# Patient Record
Sex: Male | Born: 2000 | Race: White | Hispanic: No | Marital: Single | Smoking: Current every day smoker
Health system: Southern US, Community
[De-identification: ages and names within clinical notes are randomized; demographics above are authoritative.]

## PROBLEM LIST (undated history)

## (undated) DIAGNOSIS — S42302A Unspecified fracture of shaft of humerus, left arm, initial encounter for closed fracture: Secondary | ICD-10-CM

## (undated) DIAGNOSIS — Z3A36 36 weeks gestation of pregnancy: Secondary | ICD-10-CM

## (undated) DIAGNOSIS — F909 Attention-deficit hyperactivity disorder, unspecified type: Secondary | ICD-10-CM

## (undated) DIAGNOSIS — F419 Anxiety disorder, unspecified: Secondary | ICD-10-CM

## (undated) DIAGNOSIS — H5010 Unspecified exotropia: Secondary | ICD-10-CM

## (undated) DIAGNOSIS — Z8781 Personal history of (healed) traumatic fracture: Secondary | ICD-10-CM

## (undated) DIAGNOSIS — Z8619 Personal history of other infectious and parasitic diseases: Secondary | ICD-10-CM

## (undated) DIAGNOSIS — Z87898 Personal history of other specified conditions: Secondary | ICD-10-CM

## (undated) DIAGNOSIS — Z9229 Personal history of other drug therapy: Secondary | ICD-10-CM

## (undated) HISTORY — PX: EYE MUSCLE SURGERY: SHX370

## (undated) HISTORY — PX: HYDROCELE EXCISION: SHX482

## (undated) HISTORY — PX: TYMPANOSTOMY TUBE PLACEMENT: SHX32

---
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
## 2021-05-09 ENCOUNTER — Encounter (HOSPITAL_BASED_OUTPATIENT_CLINIC_OR_DEPARTMENT_OTHER): Payer: Self-pay

## 2021-05-09 ENCOUNTER — Other Ambulatory Visit: Payer: Self-pay

## 2021-05-09 ENCOUNTER — Emergency Department (HOSPITAL_BASED_OUTPATIENT_CLINIC_OR_DEPARTMENT_OTHER)
Admission: EM | Admit: 2021-05-09 | Discharge: 2021-05-09 | Disposition: A | Discharge status: Home / Self Care | Payer: Worker's Compensation | Attending: Emergency Medicine | Admitting: Emergency Medicine

## 2021-05-09 DIAGNOSIS — F17210 Nicotine dependence, cigarettes, uncomplicated: Secondary | ICD-10-CM | POA: Insufficient documentation

## 2021-05-09 DIAGNOSIS — W57XXXA Bitten or stung by nonvenomous insect and other nonvenomous arthropods, initial encounter: Secondary | ICD-10-CM | POA: Diagnosis not present

## 2021-05-09 DIAGNOSIS — S60562A Insect bite (nonvenomous) of left hand, initial encounter: Secondary | ICD-10-CM | POA: Insufficient documentation

## 2021-05-09 NOTE — Discharge Instructions (Signed)
Use topical hydrocortisone to bite and rash.  Benadryl for itching

## 2021-05-09 NOTE — ED Triage Notes (Signed)
Pt c/o insect bite to left hand at work today-states he was collecting "brush"-slight redness/swelling noted-NAD-steady gait

## 2021-05-10 NOTE — ED Provider Notes (Signed)
[facility] EMERGENCY DEPARTMENT Provider Note   CSN: [id number] Arrival date & time: 05/09/21  1205     History Chief Complaint  Patient presents with  . Insect Bite    Eric Peters is a 20 y.o. male.  Pt reports his job sent him to be seen because he has a bite on his hand   The history is provided by the patient. No language interpreter was used.  Hand Pain This is a new problem. The problem has not changed since onset.Nothing aggravates the symptoms. Nothing relieves the symptoms. He has tried nothing for the symptoms.       Past Medical History:  Diagnosis Date  . [redacted] weeks gestation of pregnancy TWIN BIRTH  ---  PT   3 LB--  NO NICU VISIT --  NO MEDICAL ISSUES  . ADHD (attention deficit hyperactivity disorder)   . Anxiety disorder   . Arm fracture, left    LOWER ARM WITH HARD CAST  . Exotropia of both eyes   . History of idiopathic seizure AT AGE 69 -- COMBINATION VIRAL INFECTION AND 2 DAYS POST OP EAR TUBES--  X1 SEIZURE--  PER MOTHER UNKNOWN ETIOLOGY-   NO SEIZURE SINCE  . History of patellar fracture    JULY 2015  . History of RSV infection AS INFANT-- NO RESPIRATORY ISSUES SINCE AGE 39  . Immunizations up to date   . Twin birth     Patient Active Problem List   Diagnosis Date Noted  . ADHD (attention deficit hyperactivity disorder), combined type 09/08/2013  . GAD (generalized anxiety disorder) 09/08/2013  . Exotropia of left eye 05/22/2012    Class: Chronic    Past Surgical History:  Procedure Laterality Date  . EYE MUSCLE SURGERY   AGE 13   RIGHT EYE  . HYDROCELE EXCISION  AGE 39  . MEDIAN RECTUS REPAIR  05/27/2012   Procedure: MEDIAN RECTUS REPAIR;  Surgeon: Corinda Gubler, MD;  Location: [facility];  Service: Ophthalmology;  Laterality: Left;  Median RECTUS RESECTION   . MEDIAN RECTUS REPAIR Bilateral 09/07/2014   Procedure: BILATERAL MEDIAN RECTUS REPAIR;  Surgeon: Corinda Gubler, MD;  Location: [facility];  Service: Ophthalmology;  Laterality: Bilateral;  . TYMPANOSTOMY TUBE PLACEMENT  AGE 69   BILATERAL       No family history on file.  Social History   Tobacco Use  . Smoking status: Current Every Day Smoker    Types: Cigarettes  . Smokeless tobacco: Never Used  Substance Use Topics  . Alcohol use: No  . Drug use: No    Home Medications Prior to Admission medications   Medication Sig Start Date End Date Taking? Authorizing Provider  acetaminophen-codeine (CAPITAL/CODEINE) 120-12 MG/5ML suspension Take 5 mLs by mouth every 6 (six) hours as needed for pain. 09/07/14   Aura Camps, MD  ARIPiprazole (ABILIFY) 10 MG tablet Take 10 mg by mouth every morning.    [provider]  DULoxetine (CYMBALTA) 20 MG capsule Take 20 mg by mouth every morning.    [provider]  guanFACINE (INTUNIV) 2 MG TB24 SR tablet Take 2 mg by mouth every morning. 10/08/13   Elaina Pattee, MD  methylphenidate 27 MG PO CR tablet Take 27 mg by mouth every evening. Takes at 1300 daily    [provider]  methylphenidate 54 MG PO CR tablet Take 54 mg by mouth every morning.    [provider]  tobramycin-dexamethasone Wallene Dales)  ophthalmic ointment Place 1 application into both eyes 2 (two) times daily. 09/07/14   Aura Camps, MD    Allergies    Sulfa antibiotics  Review of Systems   Review of Systems  All other systems reviewed and are negative.   Physical Exam Updated Vital Signs BP 130/69 (BP Location: Right Arm)   Pulse 74   Temp 98.3 F (36.8 C) (Oral)   Resp 16   Ht 5\' 11"  (1.803 m)   Wt 68 kg   SpO2 99%   BMI 20.92 kg/m   Physical Exam Vitals reviewed.  Constitutional:      Appearance: Normal appearance.  HENT:     Head: Normocephalic.  Musculoskeletal:        General: Normal range of motion.     Comments: Small bite, looks like a mosquito bite   Skin:    General: Skin is warm.  Neurological:     General: No focal deficit  present.     Mental Status: He is alert.  Psychiatric:        Mood and Affect: Mood normal.     ED Results / Procedures / Treatments   Labs (all labs ordered are listed, but only abnormal results are displayed) Labs Reviewed - No data to display  EKG None  Radiology No results found.  Procedures Procedures   Medications Ordered in ED Medications - No data to display  ED Course  I have reviewed the triage vital signs and the nursing notes.  Pertinent labs & imaging results that were available during my care of the patient were reviewed by me and considered in my medical decision making (see chart for details).    MDM Rules/Calculators/A&P                          MDM:  Pt advised topical hydrocortisone  Final Clinical Impression(s) / ED Diagnoses Final diagnoses:  Insect bite of left hand, initial encounter    Rx / DC Orders ED Discharge Orders    None    An After Visit Summary was printed and given to the patient.    05/10/21 1117    05/12/21, MD 05/10/21 [phone]

## 2024-03-02 ENCOUNTER — Encounter (HOSPITAL_BASED_OUTPATIENT_CLINIC_OR_DEPARTMENT_OTHER): Payer: Self-pay

## 2024-03-02 ENCOUNTER — Other Ambulatory Visit: Payer: Self-pay

## 2024-03-02 ENCOUNTER — Emergency Department (HOSPITAL_BASED_OUTPATIENT_CLINIC_OR_DEPARTMENT_OTHER)

## 2024-03-02 ENCOUNTER — Emergency Department (HOSPITAL_BASED_OUTPATIENT_CLINIC_OR_DEPARTMENT_OTHER)
Admission: EM | Admit: 2024-03-02 | Discharge: 2024-03-02 | Disposition: A | Discharge status: Home / Self Care | Attending: Emergency Medicine | Admitting: Emergency Medicine

## 2024-03-02 DIAGNOSIS — G43809 Other migraine, not intractable, without status migrainosus: Secondary | ICD-10-CM | POA: Insufficient documentation

## 2024-03-02 DIAGNOSIS — J101 Influenza due to other identified influenza virus with other respiratory manifestations: Secondary | ICD-10-CM | POA: Insufficient documentation

## 2024-03-02 DIAGNOSIS — R519 Headache, unspecified: Secondary | ICD-10-CM | POA: Diagnosis present

## 2024-03-02 LAB — RESP PANEL BY RT-PCR (RSV, FLU A&B, COVID)  RVPGX2
Influenza A by PCR: POSITIVE — AB
Influenza B by PCR: NEGATIVE
Resp Syncytial Virus by PCR: NEGATIVE
SARS Coronavirus 2 by RT PCR: NEGATIVE

## 2024-03-02 MED ORDER — BUTALBITAL-APAP-CAFFEINE 50-325-40 MG PO TABS: 1.0000 | ORAL_TABLET | Freq: Once | ORAL | Status: DC

## 2024-03-02 MED ORDER — DIPHENHYDRAMINE HCL 25 MG PO CAPS
25.0000 mg | ORAL_CAPSULE | Freq: Once | ORAL | Status: AC
  Administered 2024-03-02: 25 mg via ORAL
  Filled 2024-03-02: qty 1

## 2024-03-02 MED ORDER — DEXAMETHASONE 4 MG PO TABS
10.0000 mg | ORAL_TABLET | Freq: Once | ORAL | Status: AC
  Administered 2024-03-02: 10 mg via ORAL
  Filled 2024-03-02: qty 3

## 2024-03-02 MED ORDER — PROCHLORPERAZINE MALEATE 10 MG PO TABS
10.0000 mg | ORAL_TABLET | Freq: Once | ORAL | Status: AC
  Administered 2024-03-02: 10 mg via ORAL
  Filled 2024-03-02: qty 1

## 2024-03-02 NOTE — ED Provider Notes (Signed)
 [place] EMERGENCY DEPARTMENT AT [facility] Provider Note   CSN: [id number] Arrival date & time: 03/02/24  0803     History  Chief Complaint  Patient presents with   Headache    Eric Peters is a 23 y.o. male.  Patient here with ear pain headache.  Has not felt well the last day or 2.  But he is also been having increased headaches the last few weeks.  History of some polysubstance abuse.  He has a history of ADHD anxiety disorder.  He denies any weakness numbness tingling.  Feels a lot of pressure behind his forehead.  She has had increased frequency of headaches.  He denies any vision loss speech changes difficulty walking.  The history is provided by the patient.       Home Medications Prior to Admission medications   Medication Sig Start Date End Date Taking? Authorizing Provider  acetaminophen-codeine (CAPITAL/CODEINE) 120-12 MG/5ML suspension Take 5 mLs by mouth every 6 (six) hours as needed for pain. 09/07/14   Eric Camps, MD  ARIPiprazole (ABILIFY) 10 MG tablet Take 10 mg by mouth every morning.    [provider]  DULoxetine (CYMBALTA) 20 MG capsule Take 20 mg by mouth every morning.    [provider]  guanFACINE (INTUNIV) 2 MG TB24 SR tablet Take 2 mg by mouth every morning. 10/08/13   Eric Pattee, MD  methylphenidate 27 MG PO CR tablet Take 27 mg by mouth every evening. Takes at 1300 daily    [provider]  methylphenidate 54 MG PO CR tablet Take 54 mg by mouth every morning.    [provider]  tobramycin-dexamethasone Wallene Dales) ophthalmic ointment Place 1 application into both eyes 2 (two) times daily. 09/07/14   Eric Camps, MD      Allergies    Sulfa antibiotics    Review of Systems   Review of Systems  Physical Exam Updated Vital Signs BP (!) 169/117 (BP Location: Right Arm)   Pulse (!) 101   Temp 99.4 F (37.4 C) (Oral)   Resp 20   Ht 5\' 11"  (1.803 m)   SpO2 100%   BMI 20.92 kg/m   Physical Exam Vitals and nursing note reviewed.  Constitutional:      General: He is not in acute distress.    Appearance: He is well-developed.  HENT:     Head: Normocephalic and atraumatic.     Mouth/Throat:     Mouth: Mucous membranes are moist.  Eyes:     Extraocular Movements: Extraocular movements intact.     Conjunctiva/sclera: Conjunctivae normal.     Pupils: Pupils are equal, round, and reactive to light.  Cardiovascular:     Rate and Rhythm: Normal rate and regular rhythm.     Heart sounds: Normal heart sounds. No murmur heard. Pulmonary:     Effort: Pulmonary effort is normal. No respiratory distress.     Breath sounds: Normal breath sounds.  Abdominal:     Palpations: Abdomen is soft.     Tenderness: There is no abdominal tenderness.  Musculoskeletal:        General: No swelling.     Cervical back: Normal range of motion and neck supple.  Skin:    General: Skin is warm and dry.     Capillary Refill: Capillary refill takes less than 2 seconds.  Neurological:     Mental Status: He is alert and oriented to person, place, and time.     Comments:  Plus out of 5 strength throughout, normal sensation, no drift, normal speech, normal gait, normal extraocular movement, normal visual fields  Psychiatric:        Mood and Affect: Mood normal.     ED Results / Procedures / Treatments   Labs (all labs ordered are listed, but only abnormal results are displayed) Labs Reviewed  RESP PANEL BY RT-PCR (RSV, FLU A&B, COVID)  RVPGX2 - Abnormal; Notable for the following components:      Result Value   Influenza A by PCR POSITIVE (*)    All other components within normal limits    EKG None  Radiology CT Head Wo Contrast Result Date: 03/02/2024 CLINICAL DATA:  23 year old male with increasing headache for 2 weeks. EXAM: CT HEAD WITHOUT CONTRAST TECHNIQUE: Contiguous axial images were obtained from the base of the skull through the vertex without intravenous contrast.  RADIATION DOSE REDUCTION: This exam was performed according to the departmental dose-optimization program which includes automated exposure control, adjustment of the mA and/or kV according to patient size and/or use of iterative reconstruction technique. COMPARISON:  None Available. FINDINGS: Brain: No midline shift, ventriculomegaly, mass effect, evidence of mass lesion, intracranial hemorrhage or evidence of cortically based acute infarction. Gray-white matter differentiation is within normal limits throughout the brain. Vascular: No suspicious intracranial vascular hyperdensity. Skull: Intact, negative. Sinuses/Orbits: Moderate left ethmoid and sphenoid sinus mucosal thickening. Other Visualized paranasal sinuses and mastoids are well aerated. Other: Visualized orbits and scalp soft tissues are within normal limits. IMPRESSION: 1. Normal noncontrast CT appearance of the brain. 2. Moderate left ethmoid and sphenoid paranasal sinus inflammation. Electronically Signed   By: Eric Peters M.D.   On: 03/02/2024 09:03    Procedures Procedures    Medications Ordered in ED Medications  dexamethasone (DECADRON) tablet 10 mg (has no administration in time range)  prochlorperazine (COMPAZINE) tablet 10 mg (10 mg Oral Given 03/02/24 0856)  diphenhydrAMINE (BENADRYL) capsule 25 mg (25 mg Oral Given 03/02/24 5284)    ED Course/ Medical Decision Making/ A&P                                 Medical Decision Making Amount and/or Complexity of Data Reviewed Radiology: ordered.  Risk Prescription drug management.   Eric Peters is here with headache flulike symptoms.  He is had headache for couple weeks gradual and mild intermittent taking over-the-counter medications with some relief.  But he has now developed the last 2 days more ear pain frontal sinus congestion.  He is neurologically intact on exam.  He states eye surgery as a child anxiety disorder but otherwise some cocaine marijuana use in the past.   Neurologically he is intact but he states headaches are increasing in frequency and will get a head CT to evaluate for any intracranial process.  I have very low concern for stroke or head bleed but will get a head CT.  Sounds like he is having some flulike symptoms will check viral panel.  He does not want an IV therefore we will give him some oral Compazine and Benadryl to see if that can help with his congestion headache feeling.  Overall patient positive for influenza A per my review and interpretation of labs.  Head CT is unremarkable except for some sinusitis.  Overall suspect flu symptoms causing his symptoms.  May be a migraine headache as well.  He was given Compazine Benadryl Decadron with good improvement.  Recommend continue use of Tylenol and ibuprofen at home.  Discharged in good condition.  Have no concern for other acute process at this time.  This chart was dictated using voice recognition software.  Despite best efforts to proofread,  errors can occur which can change the documentation meaning.         Final Clinical Impression(s) / ED Diagnoses Final diagnoses:  Influenza A  Other migraine without status migrainosus, not intractable    Rx / DC Orders ED Discharge Orders     None         Virgina Norfolk, DO 03/02/24 1610

## 2024-03-02 NOTE — Discharge Instructions (Signed)
 Continue 1000 mg of Tylenol every 6 hours as needed for headaches fever or chills.  Continue 800 mg ibuprofen every 8 hours as needed for headache fever and chills.  I would recommend stopping Goody powders.  I have already treated you with a long-acting steroid that should help with your congestion as well.

## 2024-03-02 NOTE — ED Notes (Signed)
 Patient transported to CT

## 2024-03-02 NOTE — ED Triage Notes (Addendum)
 C/o headache x 2 weeks constantly, light sensitivity, bleeding from left ear occasionally.  Hx of eye surgeries when younger. Hx of cocaine use, stopped 1 month ago. Marijuana use yesterday.
